# Patient Record
Sex: Male | Born: 1995 | Race: Black or African American | Hispanic: No | Marital: Single | Smoking: Never smoker
Health system: Southern US, Community
[De-identification: ages and names within clinical notes are randomized; demographics above are authoritative.]

---
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
## 2016-06-25 ENCOUNTER — Ambulatory Visit (INDEPENDENT_AMBULATORY_CARE_PROVIDER_SITE_OTHER): Payer: BLUE CROSS/BLUE SHIELD | Admitting: Family Medicine

## 2016-06-25 VITALS — BP 110/80 | HR 115 | Temp 103.8°F | Ht 70.5 in | Wt 231.8 lb

## 2016-06-25 DIAGNOSIS — J111 Influenza due to unidentified influenza virus with other respiratory manifestations: Secondary | ICD-10-CM | POA: Diagnosis not present

## 2016-06-25 MED ORDER — OSELTAMIVIR PHOSPHATE 75 MG PO CAPS: 75.0000 mg | ORAL_CAPSULE | Freq: Two times a day (BID) | ORAL | 0 refills | Status: DC

## 2016-06-25 MED ORDER — ACETAMINOPHEN 500 MG PO TABS
1000.0000 mg | ORAL_TABLET | Freq: Once | ORAL | Status: AC
  Administered 2016-06-25: 1000 mg via ORAL

## 2016-06-25 NOTE — Progress Notes (Signed)
Subjective:  By signing my name below, I, Raven Small, attest that this documentation has been prepared under the direction and in the presence of Meredith StaggersJeffrey Greene, MD.  Electronically Signed: Andrew Auaven Small, ED Scribe. 06/25/2016. 5:59 PM.   Patient ID: Mitchell Vaughn, male    DOB: [birth year], 21 y.o.   MRN: [id number]  HPI Chief Complaint  Patient presents with  . Cough    X 1 day headache, chills, bodyache, very weak, fever, sob    HPI Comments: Mitchell Vaughn is a 21 y.o. male who [facility] complaining of cough. Symptoms started with productive cough consisting of yellow/green sputum  2 days ago. Yesterday he developed fever, chills, body aches, muscle cramps and nausea. He also notes 1 episode of diarrhea yesterday. Pt has been able to drink fluids. He has been taking nyquil. He denies emesis and urinary symptoms. Pt did not get flu shot this year. He denies hx of asthma. He is not a smoker.   Patient Active Problem List   Diagnosis Date Noted  . Goiter 11/23/2012  . Overweight 11/23/2012  . Depression 11/23/2012  . Gynecomastia, male 11/10/2012   History reviewed. No pertinent past medical history. Past Surgical History:  Procedure Laterality Date  . TRACHEAL SURGERY Bilateral 1998   No Known Allergies Prior to Admission medications   Not on File   Social History   Social History  . Marital status: Single    Spouse name: N/A  . Number of children: N/A  . Years of education: N/A   Occupational History  . Not on file.   Social History Main Topics  . Smoking status: Never Smoker  . Smokeless tobacco: Never Used  . Alcohol use No  . Drug use: No  . Sexual activity: Yes    Birth control/ protection: Condom     Comment: ONe male partner - condoms every time   Other Topics Concern  . Not on file   Social History Narrative   Lives at home with mom and dad, attend [employer] will start 12th grade in the fall.   Review of  Systems  Constitutional: Positive for appetite change, chills, diaphoresis, fatigue and fever.  HENT: Negative for sore throat.   Respiratory: Positive for cough.   Gastrointestinal: Positive for diarrhea and nausea. Negative for vomiting.  Genitourinary: Negative for difficulty urinating.  Musculoskeletal: Positive for myalgias.       Objective:   Physical Exam  Constitutional: He is oriented to person, place, and time.  HENT:  Head: Normocephalic.  Right Ear: Hearing, tympanic membrane, external ear and ear canal normal.  Left Ear: Hearing, tympanic membrane, external ear and ear canal normal.  Nose: Nose normal.  Mouth/Throat: Posterior oropharyngeal erythema present.  Tonsil 2+  Neck: No thyromegaly present.  Cardiovascular: Regular rhythm, S1 normal and S2 normal.  Tachycardia present.   No murmur heard. Pulmonary/Chest: Effort normal and breath sounds normal. He has no wheezes. He has no rales.  Abdominal: Soft. Bowel sounds are normal. There is no tenderness. There is no CVA tenderness.  Lymphadenopathy:    He has cervical adenopathy ( anterior).  Neurological: He is alert and oriented to person, place, and time.  Skin: Skin is warm. He is diaphoretic.  Psychiatric: He has a normal mood and affect. His behavior is normal.   Vitals:   06/25/16 1732  BP: 110/80  Pulse: (!) 115  Temp: (!) 103.8 F (39.9 C)  TempSrc: Oral  SpO2: 96%  Weight: 231 lb 12.8 oz (105.1 kg)  Height: 5' 10.5" (1.791 m)   Assessment & Plan:   1. Influenza     Orders Placed This Encounter  Procedures  . Care order/instruction:    Work Notes printed, AVS printed and GO    Meds ordered this encounter  Medications  . oseltamivir (TAMIFLU) 75 MG capsule    Sig: Take 1 capsule (75 mg total) by mouth 2 (two) times daily.    Dispense:  10 capsule    Refill:  0  . acetaminophen (TYLENOL) tablet 1,000 mg    I personally performed the services described in this documentation, which was  scribed in my presence. The recorded information has been reviewed and considered, and addended by me as needed.   Norberto Sorenson, M.D.  Primary Care at [facility] [address] [place], [place] [address] [phone] phone [phone] fax  07/13/16 4:11 AM

## 2016-06-25 NOTE — Patient Instructions (Addendum)
IF you received an x-ray today, you will receive an invoice from [facility]. Please contact [facility] at [phone] with questions or concerns regarding your invoice.   IF you received labwork today, you will receive an invoice from [facility]. Please contact [facility] at [phone] with questions or concerns regarding your invoice.   Our billing staff will not be able to assist you with questions regarding bills from these companies.  You will be contacted with the lab results as soon as they are available. The fastest way to get your results is to activate your My Chart account. Instructions are located on the last page of this paperwork. If you have not heard from us regarding the results in 2 weeks, please contact this office.      Rehydration, Adult Rehydration is the replacement of body fluids and salts and minerals (electrolytes) that are lost during dehydration. Dehydration is when there is not enough fluid or water in the body. This happens when you lose more fluids than you take in. Common causes of dehydration include:  Vomiting.  Diarrhea.  Excessive sweating, such as from heat exposure or exercise.  Taking medicines that cause the body to lose excess fluid (diuretics).  Impaired kidney function.  Not drinking enough fluid.  Certain illnesses or infections.  Certain poorly controlled long-term (chronic) illnesses, such as diabetes, heart disease, and kidney disease. Symptoms of mild dehydration may include thirst, dry lips and mouth, dry skin, and dizziness. Symptoms of severe dehydration may include increased heart rate, confusion, fainting, and not urinating. You can rehydrate by drinking certain fluids or getting fluids through an IV tube, as told by your health care provider. What are the risks? Generally, rehydration is safe. However, one problem that can happen is taking in too much fluid (overhydration). This is rare. If overhydration  happens, it can cause an electrolyte imbalance, kidney failure, or a decrease in salt (sodium) levels in the body. How to rehydrate Follow instructions from your health care provider for rehydration. The kind of fluid you should drink and the amount you should drink depend on your condition.  If directed by your health care provider, drink an oral rehydration solution (ORS). This is a drink designed to treat dehydration that is found in pharmacies and retail stores.  Make an ORS by following instructions on the package.  Start by drinking small amounts, about  cup (120 mL) every 5-10 minutes.  Slowly increase how much you drink until you have taken the amount recommended by your health care provider.  Drink enough clear fluids to keep your urine clear or pale yellow. If you were instructed to drink an ORS, finish the ORS first, then start slowly drinking other clear fluids. Drink fluids such as:  Water. Do not drink only water. Doing that can lead to having too little sodium in your body (hyponatremia).  Ice chips.  Fruit juice that you have added water to (diluted juice).  Low-calorie sports drinks.  If you are severely dehydrated, your health care provider may recommend that you receive fluids through an IV tube in the hospital.  Do not take sodium tablets. Doing that can lead to the condition of having too much sodium in your body (hypernatremia). Eating while you rehydrate Follow instructions from your health care provider about what to eat while you rehydrate. Your health care provider may recommend that you slowly begin eating regular foods in small amounts.  Eat foods that contain a healthy balance of electrolytes,  such as bananas, oranges, potatoes, tomatoes, and spinach.  Avoid foods that are greasy or contain a lot of fat or sugar. In some cases, you may get nutrition through a feeding tube that is passed through your nose and into your stomach (nasogastric tube, or NG  tube). This may be done if you have uncontrolled vomiting or diarrhea. Beverages to avoid Certain beverages may make dehydration worse. While you rehydrate, avoid:  Alcohol.  Caffeine.  Drinks that contain a lot of sugar. These include:  High-calorie sports drinks.  Fruit juice that is not diluted.  Soda. Check nutrition labels to see how much sugar or caffeine a beverage contains. Signs of dehydration recovery You may be recovering from dehydration if:  You are urinating more often than before you started rehydrating.  Your urine is clear or pale yellow.  Your energy level improves.  You vomit less frequently.  You have diarrhea less frequently.  Your appetite improves or returns to normal.  You feel less dizzy or less light-headed.  Your skin tone and color start to look more normal. Contact a health care provider if:  You continue to have symptoms of mild dehydration, such as:  Thirst.  Dry lips.  Slightly dry mouth.  Dry, warm skin.  Dizziness.  You continue to vomit or have diarrhea. Get help right away if:  You have symptoms of dehydration that get worse.  You feel:  Confused.  Weak.  Like you are going to faint.  You have not urinated in 6-8 hours.  You have very dark urine.  You have trouble breathing.  Your heart rate while sitting still is over 100 beats a minute.  You cannot drink fluids without vomiting.  You have vomiting or diarrhea that:  Gets worse.  Does not go away.  You have a fever. This information is not intended to replace advice given to you by your health care provider. Make sure you discuss any questions you have with your health care provider. Document Released: 08/11/2011 Document Revised: 12/07/2015 Document Reviewed: 07/13/2015 Elsevier Interactive Patient Education  2017 Elsevier Inc.  Influenza, Adult Influenza, more commonly known as "the flu," is a viral infection that primarily affects the  respiratory tract. The respiratory tract includes organs that help you breathe, such as the lungs, nose, and throat. The flu causes many common cold symptoms, as well as a high fever and body aches. The flu spreads easily from person to person (is contagious). Getting a flu shot (influenza vaccination) every year is the best way to prevent influenza. What are the causes? Influenza is caused by a virus. You can catch the virus by:  Breathing in droplets from an infected person's cough or sneeze.  Touching something that was recently contaminated with the virus and then touching your mouth, nose, or eyes. What increases the risk? The following factors may make you more likely to get the flu:  Not cleaning your hands frequently with soap and water or alcohol-based hand sanitizer.  Having close contact with many people during cold and flu season.  Touching your mouth, eyes, or nose without washing or sanitizing your hands first.  Not drinking enough fluids or not eating a healthy diet.  Not getting enough sleep or exercise.  Being under a high amount of stress.  Not getting a yearly (annual) flu shot. You may be at a higher risk of complications from the flu, such as a severe lung infection (pneumonia), if you:  Are over the age of 22.  Are pregnant.  Have a weakened disease-fighting system (immune system). You may have a weakened immune system if you:  Have HIV or AIDS.  Are undergoing chemotherapy.  Aretaking medicines that reduce the activity of (suppress) the immune system.  Have a long-term (chronic) illness, such as heart disease, kidney disease, diabetes, or lung disease.  Have a liver disorder.  Are obese.  Have anemia. What are the signs or symptoms? Symptoms of this condition typically last 4-10 days and may include:  Fever.  Chills.  Headache, body aches, or muscle aches.  Sore throat.  Cough.  Runny or congested nose.  Chest discomfort and  cough.  Poor appetite.  Weakness or tiredness (fatigue).  Dizziness.  Nausea or vomiting. How is this diagnosed? This condition may be diagnosed based on your medical history and a physical exam. Your health care provider may do a nose or throat swab test to confirm the diagnosis. How is this treated? If influenza is detected early, you can be treated with antiviral medicine that can reduce the length of your illness and the severity of your symptoms. This medicine may be given by mouth (orally) or through an IV tube that is inserted in one of your veins. The goal of treatment is to relieve symptoms by taking care of yourself at home. This may include taking over-the-counter medicines, drinking plenty of fluids, and adding humidity to the air in your home. In some cases, influenza goes away on its own. Severe influenza or complications from influenza may be treated in a hospital. Follow these instructions at home:  Take over-the-counter and prescription medicines only as told by your health care provider.  Use a cool mist humidifier to add humidity to the air in your home. This can make breathing easier.  Rest as needed.  Drink enough fluid to keep your urine clear or pale yellow.  Cover your mouth and nose when you cough or sneeze.  Wash your hands with soap and water often, especially after you cough or sneeze. If soap and water are not available, use hand sanitizer.  Stay home from work or school as told by your health care provider. Unless you are visiting your health care provider, try to avoid leaving home until your fever has been gone for 24 hours without the use of medicine.  Keep all follow-up visits as told by your health care provider. This is important. How is this prevented?  Getting an annual flu shot is the best way to avoid getting the flu. You may get the flu shot in late summer, fall, or winter. Ask your health care provider when you should get your flu  shot.  Wash your hands often or use hand sanitizer often.  Avoid contact with people who are sick during cold and flu season.  Eat a healthy diet, drink plenty of fluids, get enough sleep, and exercise regularly. Contact a health care provider if:  You develop new symptoms.  You have:  Chest pain.  Diarrhea.  A fever.  Your cough gets worse.  You produce more mucus.  You feel nauseous or you vomit. Get help right away if:  You develop shortness of breath or difficulty breathing.  Your skin or nails turn a bluish color.  You have severe pain or stiffness in your neck.  You develop a sudden headache or sudden pain in your face or ear.  You cannot stop vomiting. This information is not intended to replace advice given to you by your health care  provider. Make sure you discuss any questions you have with your health care provider. Document Released: 05/16/2000 Document Revised: 10/25/2015 Document Reviewed: 03/13/2015 Elsevier Interactive Patient Education  2017 [employer].

## 2016-11-18 ENCOUNTER — Ambulatory Visit (INDEPENDENT_AMBULATORY_CARE_PROVIDER_SITE_OTHER): Payer: BLUE CROSS/BLUE SHIELD | Admitting: Physician Assistant

## 2016-11-18 ENCOUNTER — Encounter: Payer: Self-pay | Admitting: Physician Assistant

## 2016-11-18 VITALS — BP 123/74 | HR 76 | Temp 99.0°F | Resp 16 | Ht 69.5 in | Wt 207.6 lb

## 2016-11-18 DIAGNOSIS — S0181XA Laceration without foreign body of other part of head, initial encounter: Secondary | ICD-10-CM

## 2016-11-18 DIAGNOSIS — Z23 Encounter for immunization: Secondary | ICD-10-CM

## 2016-11-18 NOTE — Patient Instructions (Addendum)
Sutured Wound Care Sutures are stitches that can be used to close wounds. Taking care of your wound properly can help prevent pain and infection. It can also help your wound to heal more quickly. How is this treated? Wound Care  Keep the wound clean and dry.  If you were given a bandage (dressing), change it at least one time per day or as told by your doctor. You should also change it if it gets wet or dirty.  Keep the wound completely dry for the first 24 hours or as told by your doctor. After that time, you may shower or bathe. However, make sure that the wound is not soaked in water until the sutures have been removed.  Clean the wound one time each day or as told by your doctor. ? Wash the wound with soap and water. ? Rinse the wound with water to remove all soap. ? Pat the wound dry with a clean towel. Do not rub the wound.  After cleaning the wound, put a thin layer of antibiotic ointment on it as told your doctor. This ointment: ? Helps to prevent infection. ? Keeps the bandage from sticking to the wound.  Have the sutures removed as told by your doctor. General Instructions  Take or apply medicines only as told by your doctor.  To help prevent scarring, make sure to cover your wound with sunscreen whenever you are outside after the sutures are removed and the wound is healed. Make sure to wear a sunscreen of at least 30 SPF.  If you were prescribed an antibiotic medicine or ointment, finish all of it even if you start to feel better.  Do not scratch or pick at the wound.  Keep all follow-up visits as told by your doctor. This is important.  Check your wound every day for signs of infection. Watch for: ? Redness, swelling, or pain. ? Fluid, blood, or pus.  Raise (elevate) the injured area above the level of your heart while you are sitting or lying down, if possible.  Avoid stretching your wound.  Drink enough fluids to keep your pee (urine) clear or pale  yellow. Contact a doctor if:  You were given a tetanus shot and you have any of these where the needle went in: ? Swelling. ? Very bad pain. ? Redness. ? Bleeding.  You have a fever.  A wound that was closed breaks open.  You notice a bad smell coming from the wound.  You notice something coming out of the wound, such as wood or glass.  Medicine does not help your pain.  You have any of these at the site of the wound. ? More redness. ? More swelling. ? More pain.  You have any of these coming from the wound. ? Fluid. ? Blood. ? Pus.  You notice a change in the color of your skin near the wound.  You need to change the bandage often due to fluid, blood, or pus coming from the wound.  You have a new rash.  You have numbness around the wound. Get help right away if:  You have very bad swelling around the wound.  Your pain suddenly gets worse and is very bad.  You have painful lumps near the wound or on skin that is anywhere on your body.  You have a red streak going away from the wound.  The wound is on your hand or foot and you cannot move a finger or toe like normal.  The   wound is on your hand or foot and you notice that your fingers or toes look pale or bluish. This information is not intended to replace advice given to you by your health care provider. Make sure you discuss any questions you have with your health care provider. Document Released: 11/05/2007 Document Revised: 10/25/2015 Document Reviewed: 12/29/2012 Elsevier Interactive Patient Education  2017 Elsevier Inc.     IF you received an x-ray today, you will receive an invoice from [facility]. Please contact [facility] at [phone] with questions or concerns regarding your invoice.   IF you received labwork today, you will receive an invoice from [facility]. Please contact [facility] at [phone] with questions or concerns regarding your invoice.   Our billing staff will not  be able to assist you with questions regarding bills from these companies.  You will be contacted with the lab results as soon as they are available. The fastest way to get your results is to activate your My Chart account. Instructions are located on the last page of this paperwork. If you have not heard from us regarding the results in 2 weeks, please contact this office.      

## 2016-11-23 NOTE — Progress Notes (Signed)
[facility] [address], [place] [phone]  Date:  11/18/2016   Name:  Mitchell Vaughn   DOB:  1995/12/18   MRN:  [id number]  PCP:  Patient, No Pcp Per    History of Present Illness:  Mitchell Vaughn is a 21 y.o. male patient who presents to PCP with  Chief Complaint  Patient presents with  . Facial Injury    UNDER RIGHT EYE TODAY ABOUT 30 MINUTES AGO     Patient reports he was playing basketball, when he was elbowed in the face and had a gash.  No loc.  No pain with eye movement.    Patient Active Problem List   Diagnosis Date Noted  . Goiter 11/23/2012  . Overweight 11/23/2012  . Depression 11/23/2012  . Gynecomastia, male 11/10/2012    No past medical history on file.  Past Surgical History:  Procedure Laterality Date  . TRACHEAL SURGERY Bilateral 1998    Social History  Substance Use Topics  . Smoking status: Never Smoker  . Smokeless tobacco: Never Used  . Alcohol use No    Family History  Problem Relation Age of Onset  . Hypertension Maternal Grandmother   . Hypertension Maternal Grandfather   . Cancer Paternal Grandmother     Allergies  Allergen Reactions  . Penicillins Itching    Medication list has been reviewed and updated.  No current outpatient prescriptions on file prior to visit.   No current facility-administered medications on file prior to visit.     ROS ROS otherwise unremarkable unless listed above.  Physical Examination: BP 123/74 (BP Location: Right Arm, Patient Position: Sitting, Cuff Size: Large)   Pulse 76   Temp 99 F (37.2 C) (Oral)   Resp 16   Ht 5' 9.5" (1.765 m)   Wt 207 lb 9.6 oz (94.2 kg)   SpO2 99%   BMI 30.22 kg/m  Ideal Body Weight: Weight in (lb) to have BMI = 25: 171.4  Physical Exam  Constitutional: He is oriented to person, place, and time. He appears well-developed and well-nourished. No distress.  HENT:  Head: Normocephalic and atraumatic.  Eyes: Conjunctivae and  EOM are normal. Pupils are equal, round, and reactive to light.  Mild ecchymosis under the eyelid  1cm beneath the right eye.    Cardiovascular: Normal rate.   Pulmonary/Chest: Effort normal. No respiratory distress.  Neurological: He is alert and oriented to person, place, and time.  Skin: Skin is warm and dry. He is not diaphoretic.  Psychiatric: He has a normal mood and affect. His behavior is normal.   Procedure: verbal consent obtained.  1% lidocaine applied to the area of the laceration.  Cleansed with soap and water.  7-0 ethilon utilized to place 2 simple interrupted sutures at the laceration site.  Cleansed with normal saline.  Thin layer of mupirocin placed over the area.     Assessment and Plan: Mitchell Vaughn is a 21 y.o. male who is here today for cc of laceration . Tetanus given rtc in 5 days for suture removal.  Facial laceration, initial encounter - Plan: Tdap vaccine greater than or equal to 7yo IM  Trena PlattStephanie Yeila Morro, PA-C [facility] 6/24/20182:28 AM

## 2016-11-24 ENCOUNTER — Encounter: Payer: Self-pay | Admitting: Emergency Medicine

## 2016-11-24 ENCOUNTER — Ambulatory Visit (INDEPENDENT_AMBULATORY_CARE_PROVIDER_SITE_OTHER): Payer: BLUE CROSS/BLUE SHIELD | Admitting: Emergency Medicine

## 2016-11-24 VITALS — BP 100/60 | HR 78 | Temp 98.3°F | Resp 16 | Ht 69.75 in | Wt 211.4 lb

## 2016-11-24 DIAGNOSIS — S0181XA Laceration without foreign body of other part of head, initial encounter: Secondary | ICD-10-CM | POA: Insufficient documentation

## 2016-11-24 DIAGNOSIS — S0181XD Laceration without foreign body of other part of head, subsequent encounter: Secondary | ICD-10-CM

## 2016-11-24 DIAGNOSIS — Z4802 Encounter for removal of sutures: Secondary | ICD-10-CM | POA: Diagnosis not present

## 2016-11-24 NOTE — Progress Notes (Signed)
Mitchell Vaughn 20 y.o.   Chief Complaint  Patient presents with  . Suture / Staple Removal    FACE    HISTORY OF PRESENT ILLNESS: This is a 21 y.o. male here for suture removal; sutures placed 6 days ago. HPI   Prior to Admission medications   Not on File    Allergies  Allergen Reactions  . Penicillins Itching    Patient Active Problem List   Diagnosis Date Noted  . Goiter 11/23/2012  . Overweight 11/23/2012  . Depression 11/23/2012  . Gynecomastia, male 11/10/2012    No past medical history on file.  Past Surgical History:  Procedure Laterality Date  . TRACHEAL SURGERY Bilateral 1998    Social History   Social History  . Marital status: Single    Spouse name: N/A  . Number of children: N/A  . Years of education: N/A   Occupational History  . Not on file.   Social History Main Topics  . Smoking status: Never Smoker  . Smokeless tobacco: Never Used  . Alcohol use No  . Drug use: No  . Sexual activity: Yes    Birth control/ protection: Condom     Comment: ONe male partner - condoms every time   Other Topics Concern  . Not on file   Social History Narrative   Lives at home with mom and dad, attend [employer] will start 12th grade in the fall.    Family History  Problem Relation Age of Onset  . Hypertension Maternal Grandmother   . Hypertension Maternal Grandfather   . Cancer Paternal Grandmother      Review of Systems  Constitutional: Negative for chills and fever.  Skin: Negative for rash.   Vitals:   11/24/16 1406  BP: 100/60  Pulse: 78  Resp: 16  Temp: 98.3 F (36.8 C)    Physical Exam  Constitutional: He is oriented to person, place, and time. He appears well-developed and well-nourished.  HENT:  Head: Normocephalic and atraumatic.  Eyes: EOM are normal. Pupils are equal, round, and reactive to light.  Neck: Normal range of motion.  Cardiovascular: Normal rate.   Pulmonary/Chest: Effort normal.  Musculoskeletal:  Normal range of motion.  Neurological: He is alert and oriented to person, place, and time.  Skin: Skin is warm and dry. Capillary refill takes less than 2 seconds.  Well-healed laceration to right infraorbital area; 2 sutures in place.  Psychiatric: He has a normal mood and affect. His behavior is normal.  Vitals reviewed.  Patient presents for suture removal. The wound is well healed without signs of infection.  The sutures are removed. Wound care and activity instructions given. Return prn.   ASSESSMENT & PLAN: Mitchell Vaughn was seen today for suture / staple removal.  Diagnoses and all orders for this visit:  Facial laceration, subsequent encounter  Encounter for removal of sutures   Patient Instructions       IF you received an x-ray today, you will receive an invoice from [facility]. Please contact [facility] at [phone] with questions or concerns regarding your invoice.   IF you received labwork today, you will receive an invoice from [facility]. Please contact [facility] at [phone] with questions or concerns regarding your invoice.   Our billing staff will not be able to assist you with questions regarding bills from these companies.  You will be contacted with the lab results as soon as they are available. The fastest way to get your results is to activate  your My Chart account. Instructions are located on the last page of this paperwork. If you have not heard from us regarding the results in 2 weeks, please contact this office.    Suture Removal, Care After Refer to this sheet in the next few weeks. These instructions provide you with information on caring for yourself after your procedure. Your health care provider may also give you more specific instructions. Your treatment has been planned according to current medical practices, but problems sometimes occur. Call your health care provider if you have any problems or questions after your  procedure. What can I expect after the procedure? After your stitches (sutures) are removed, it is typical to have the following:  Some discomfort and swelling in the wound area.  Slight redness in the area.  Follow these instructions at home:  If you have skin adhesive strips over the wound area, do not take the strips off. They will fall off on their own in a few days. If the strips remain in place after 14 days, you may remove them.  Change any bandages (dressings) at least once a day or as directed by your health care provider. If the bandage sticks, soak it off with warm, soapy water.  Apply cream or ointment only as directed by your health care provider. If using cream or ointment, wash the area with soap and water 2 times a day to remove all the cream or ointment. Rinse off the soap and pat the area dry with a clean towel.  Keep the wound area dry and clean. If the bandage becomes wet or dirty, or if it develops a bad smell, change it as soon as possible.  Continue to protect the wound from injury.  Use sunscreen when out in the sun. New scars become sunburned easily. Contact a health care provider if:  You have increasing redness, swelling, or pain in the wound.  You see pus coming from the wound.  You have a fever.  You notice a bad smell coming from the wound or dressing.  Your wound breaks open (edges not staying together). This information is not intended to replace advice given to you by your health care provider. Make sure you discuss any questions you have with your health care provider. Document Released: 02/11/2001 Document Revised: 10/25/2015 Document Reviewed: 12/29/2012 Elsevier Interactive Patient Education  2017 [employer] Inc.      Edwina BarthMiguel Sherrell Farish, MD [facility]

## 2016-11-24 NOTE — Patient Instructions (Addendum)
     IF you received an x-ray today, you will receive an invoice from [facility]. Please contact [facility] at [phone] with questions or concerns regarding your invoice.   IF you received labwork today, you will receive an invoice from [facility]. Please contact [facility] at [phone] with questions or concerns regarding your invoice.   Our billing staff will not be able to assist you with questions regarding bills from these companies.  You will be contacted with the lab results as soon as they are available. The fastest way to get your results is to activate your My Chart account. Instructions are located on the last page of this paperwork. If you have not heard from us regarding the results in 2 weeks, please contact this office.    Suture Removal, Care After Refer to this sheet in the next few weeks. These instructions provide you with information on caring for yourself after your procedure. Your health care provider may also give you more specific instructions. Your treatment has been planned according to current medical practices, but problems sometimes occur. Call your health care provider if you have any problems or questions after your procedure. What can I expect after the procedure? After your stitches (sutures) are removed, it is typical to have the following:  Some discomfort and swelling in the wound area.  Slight redness in the area.  Follow these instructions at home:  If you have skin adhesive strips over the wound area, do not take the strips off. They will fall off on their own in a few days. If the strips remain in place after 14 days, you may remove them.  Change any bandages (dressings) at least once a day or as directed by your health care provider. If the bandage sticks, soak it off with warm, soapy water.  Apply cream or ointment only as directed by your health care provider. If using cream or ointment, wash the area with soap and water 2  times a day to remove all the cream or ointment. Rinse off the soap and pat the area dry with a clean towel.  Keep the wound area dry and clean. If the bandage becomes wet or dirty, or if it develops a bad smell, change it as soon as possible.  Continue to protect the wound from injury.  Use sunscreen when out in the sun. New scars become sunburned easily. Contact a health care provider if:  You have increasing redness, swelling, or pain in the wound.  You see pus coming from the wound.  You have a fever.  You notice a bad smell coming from the wound or dressing.  Your wound breaks open (edges not staying together). This information is not intended to replace advice given to you by your health care provider. Make sure you discuss any questions you have with your health care provider. Document Released: 02/11/2001 Document Revised: 10/25/2015 Document Reviewed: 12/29/2012 Elsevier Interactive Patient Education  2017 [employer]  

## 2017-08-31 ENCOUNTER — Encounter: Payer: Self-pay | Admitting: Physician Assistant

## 2021-08-23 ENCOUNTER — Other Ambulatory Visit: Payer: Self-pay

## 2021-08-23 ENCOUNTER — Emergency Department (HOSPITAL_COMMUNITY): Payer: No Typology Code available for payment source

## 2021-08-23 ENCOUNTER — Emergency Department (HOSPITAL_COMMUNITY)
Admission: EM | Admit: 2021-08-23 | Discharge: 2021-08-23 | Disposition: A | Discharge status: Home / Self Care | Payer: No Typology Code available for payment source | Attending: Emergency Medicine | Admitting: Emergency Medicine

## 2021-08-23 DIAGNOSIS — S29011A Strain of muscle and tendon of front wall of thorax, initial encounter: Secondary | ICD-10-CM | POA: Diagnosis not present

## 2021-08-23 DIAGNOSIS — S299XXA Unspecified injury of thorax, initial encounter: Secondary | ICD-10-CM | POA: Diagnosis present

## 2021-08-23 DIAGNOSIS — X509XXA Other and unspecified overexertion or strenuous movements or postures, initial encounter: Secondary | ICD-10-CM | POA: Diagnosis not present

## 2021-08-23 DIAGNOSIS — D72829 Elevated white blood cell count, unspecified: Secondary | ICD-10-CM | POA: Diagnosis not present

## 2021-08-23 DIAGNOSIS — R1031 Right lower quadrant pain: Secondary | ICD-10-CM | POA: Insufficient documentation

## 2021-08-23 LAB — URINALYSIS, ROUTINE W REFLEX MICROSCOPIC
Bilirubin Urine: NEGATIVE
Glucose, UA: NEGATIVE mg/dL
Hgb urine dipstick: NEGATIVE
Ketones, ur: 5 mg/dL — AB
Leukocytes,Ua: NEGATIVE
Nitrite: NEGATIVE
Protein, ur: NEGATIVE mg/dL
Specific Gravity, Urine: 1.035 — ABNORMAL HIGH (ref 1.005–1.030)
pH: 5 (ref 5.0–8.0)

## 2021-08-23 LAB — CBC WITH DIFFERENTIAL/PLATELET
Abs Immature Granulocytes: 0.03 10*3/uL (ref 0.00–0.07)
Basophils Absolute: 0 10*3/uL (ref 0.0–0.1)
Basophils Relative: 0 %
Eosinophils Absolute: 0.2 10*3/uL (ref 0.0–0.5)
Eosinophils Relative: 2 %
HCT: 43.3 % (ref 39.0–52.0)
Hemoglobin: 15.4 g/dL (ref 13.0–17.0)
Immature Granulocytes: 0 %
Lymphocytes Relative: 24 %
Lymphs Abs: 2.8 10*3/uL (ref 0.7–4.0)
MCH: 30.4 pg (ref 26.0–34.0)
MCHC: 35.6 g/dL (ref 30.0–36.0)
MCV: 85.4 fL (ref 80.0–100.0)
Monocytes Absolute: 0.9 10*3/uL (ref 0.1–1.0)
Monocytes Relative: 7 %
Neutro Abs: 7.9 10*3/uL — ABNORMAL HIGH (ref 1.7–7.7)
Neutrophils Relative %: 67 %
Platelets: 287 10*3/uL (ref 150–400)
RBC: 5.07 MIL/uL (ref 4.22–5.81)
RDW: 12.2 % (ref 11.5–15.5)
WBC: 11.9 10*3/uL — ABNORMAL HIGH (ref 4.0–10.5)
nRBC: 0 % (ref 0.0–0.2)

## 2021-08-23 LAB — BASIC METABOLIC PANEL
Anion gap: 7 (ref 5–15)
BUN: 19 mg/dL (ref 6–20)
CO2: 26 mmol/L (ref 22–32)
Calcium: 9.4 mg/dL (ref 8.9–10.3)
Chloride: 103 mmol/L (ref 98–111)
Creatinine, Ser: 1.07 mg/dL (ref 0.61–1.24)
GFR, Estimated: >60 mL/min (ref >60)
Glucose, Bld: 89 mg/dL (ref 70–99)
Potassium: 3.9 mmol/L (ref 3.5–5.1)
Sodium: 136 mmol/L (ref 135–145)

## 2021-08-23 MED ORDER — LIDOCAINE 5 % EX PTCH
1.0000 | MEDICATED_PATCH | CUTANEOUS | Status: DC
  Administered 2021-08-23: 1 via TRANSDERMAL
  Filled 2021-08-23: qty 1

## 2021-08-23 MED ORDER — OXYCODONE-ACETAMINOPHEN 5-325 MG PO TABS
1.0000 | ORAL_TABLET | Freq: Once | ORAL | Status: AC
  Administered 2021-08-23: 1 via ORAL
  Filled 2021-08-23: qty 1

## 2021-08-23 NOTE — ED Provider Notes (Signed)
?[facility]-EMERGENCY DEPT ?Provider Note ? ? ?CSN: [id number] ?Arrival date & time: 08/23/21  2017 ? ?  ? ?History ? ?Chief Complaint  ?Patient presents with  ? Flank Pain  ? ? ?Mitchell Vaughn is a 26 y.o. male who presents to the ED for evaluation of right-sided flank pain that started after coughing 2 days ago.  Patient states he heard a pop at the time and since then he has had a dull aching pain worse with laughing or coughing in the right lateral flank.  Patient denies urinary symptoms including dysuria, hematuria, increased frequency.  He denies illness including congestion, cough, diarrhea abdominal pain or vomiting.  No treatment prior to arrival.  He denies chest pain, numbness and tingling.  He has no other complaints. ? ? ?Flank Pain ? ? ?  ? ?Home Medications ?Prior to Admission medications   ?Not on File  ?   ? ?Allergies    ?Penicillins   ? ?Review of Systems   ?Review of Systems  ?Genitourinary:  Positive for flank pain.  ? ?Physical Exam ?Updated Vital Signs ?BP (!) 154/108 (BP Location: Left Arm)   Pulse 96   Temp 98.4 ?F (36.9 ?C) (Oral)   Resp 17   Ht 5\' 10"  (1.778 m)   Wt 117.9 kg   SpO2 93%   BMI 37.31 kg/m?  ?Physical Exam ?Vitals and nursing note reviewed.  ?Constitutional:   ?   General: He is not in acute distress. ?   Appearance: He is not ill-appearing.  ?HENT:  ?   Head: Atraumatic.  ?Eyes:  ?   Conjunctiva/sclera: Conjunctivae normal.  ?Cardiovascular:  ?   Rate and Rhythm: Normal rate and regular rhythm.  ?   Pulses: Normal pulses.  ?   Heart sounds: No murmur heard. ?Pulmonary:  ?   Effort: Pulmonary effort is normal. No respiratory distress.  ?   Breath sounds: Normal breath sounds.  ?Abdominal:  ?   General: Abdomen is flat. There is no distension.  ?   Palpations: Abdomen is soft.  ?   Tenderness: There is no abdominal tenderness.  ?Musculoskeletal:     ?   General: Normal range of motion.  ?   Cervical back: Normal range of motion.  ?   Comments:  Focally tender at the bottom right rib cage without bruising, swelling or palpable deformities  ?Skin: ?   General: Skin is warm and dry.  ?   Capillary Refill: Capillary refill takes less than 2 seconds.  ?Neurological:  ?   General: No focal deficit present.  ?   Mental Status: He is alert.  ?Psychiatric:     ?   Mood and Affect: Mood normal.  ? ? ?ED Results / Procedures / Treatments   ?Labs ?(all labs ordered are listed, but only abnormal results are displayed) ?Labs Reviewed  ?CBC WITH DIFFERENTIAL/PLATELET - Abnormal; Notable for the following components:  ?    Result Value  ? WBC 11.9 (*)   ? Neutro Abs 7.9 (*)   ? All other components within normal limits  ?URINALYSIS, ROUTINE W REFLEX MICROSCOPIC - Abnormal; Notable for the following components:  ? Specific Gravity, Urine 1.035 (*)   ? Ketones, ur 5 (*)   ? All other components within normal limits  ?BASIC METABOLIC PANEL  ? ? ?EKG ?None ? ?Radiology ?DG Ribs Unilateral W/Chest Right ? ?Result Date: 08/23/2021 ?CLINICAL DATA:  Right-sided rib pain following coughing episode, initial encounter EXAM: RIGHT  RIBS AND CHEST - 3+ VIEW COMPARISON:  None. FINDINGS: Cardiac shadow is within normal limits. The lungs are well aerated without focal infiltrate, effusion or pneumothorax. No acute rib fracture is noted. IMPRESSION: No acute rib fracture seen. Electronically Signed   By: Alcide Clever M.D.   On: 08/23/2021 21:17  ? ?CT Renal Stone Study ? ?Result Date: 08/23/2021 ?CLINICAL DATA:  Right flank pain. EXAM: CT ABDOMEN AND PELVIS WITHOUT CONTRAST TECHNIQUE: Multidetector CT imaging of the abdomen and pelvis was performed following the standard protocol without IV contrast. RADIATION DOSE REDUCTION: This exam was performed according to the departmental dose-optimization program which includes automated exposure control, adjustment of the mA and/or kV according to patient size and/or use of iterative reconstruction technique. COMPARISON:  None. FINDINGS: Lower  chest: No acute abnormality. Hepatobiliary: 19 cm length mildly steatotic liver, otherwise unremarkable without contrast. Gallbladder and bile ducts are unremarkable. Pancreas: Unremarkable without contrast. Spleen: Unremarkable without contrast and normal in size. Adrenals/Urinary Tract: There is no adrenal mass. The unenhanced renal cortex is unremarkable. There are no intrarenal stones. There is no hydronephrosis or ureteral stone either side. The bladder is contracted but grossly unremarkable. Stomach/Bowel: The stomach distended with food substrate and fluid but has a normal wall thickness. There is no small bowel dilatation or inflammation. The appendix is normal. There are scattered colonic diverticula without evidence of colitis or diverticulitis. No significant or large stool burden. Vascular/Lymphatic: No significant vascular findings are present. No enlarged abdominal or pelvic lymph nodes. Reproductive: Normal prostate size and shape. Other: There is no free air, hemorrhage or fluid common no incarcerated hernia. The ventral wall is intact. Musculoskeletal: Slight lumbar dextrorotary scoliosis. At L4-5, broad-based posterior disc osteophyte complex lateralizing to the left causes moderate spinal canal stenosis and appears to compress the descending left L5 nerve root. 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 No acute or further significant osseous findings. IMPRESSION: 1. No acute noncontrast CT abnormality. 2. No radiopaque urinary stone or obstructive uropathy findings. 3. Mildly prominent liver with mild steatosis. 4. Stomach distended with fluid and food products, which could be due to a recent meal or impaired gastric emptying. There is no duodenal dilatation or gastroduodenal wall thickening. 5. Uncomplicated colonic diverticula. 6. 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  Somewhat short pedicles noted in the lumbar region also. Electronically Signed   By: Almira Bar M.D.   On: 08/23/2021 22:14   ? ?Procedures ?Procedures  ? ? ?Medications Ordered in ED ?Medications  ?lidocaine (LIDODERM) 5 % 1 patch (has no administration in time range)  ?oxyCODONE-acetaminophen (PERCOCET/ROXICET) 5-325 MG per tablet 1 tablet (has no administration in time range)  ?oxyCODONE-acetaminophen (PERCOCET/ROXICET) 5-325 MG per tablet 1 tablet (1 tablet Oral Given 08/23/21 2056)  ? ? ?ED Course/ Medical Decision Making/ A&P ?  ?                        ?Medical Decision Making ?Amount and/or Complexity of Data Reviewed ?Labs: ordered. ?Radiology: ordered. ? ?Risk ?Prescription drug management. ? ? ?History:  ?Per HPI ?Social determinants of health: none ? ?Initial impression: ? ?This patient presents to the ED for concern of flank pain, this involves an extensive number of treatment options, and is a complaint that carries with it a high risk of complications and morbidity.   Differential diagnosis of her lower abdominal considerations include  pelvic inflammatory disease, ectopic pregnancy, appendicitis, urinary calculi, primary dysmenorrhea, septic abortion, ruptured ovarian cyst or tumor, ovarian torsion, tubo-ovarian abscess, degeneration of fibroid, endometriosis, diverticulitis, cystitis. ?Patient is 26 years old, otherwise healthy, in no acute distress.  He is nontoxic-appearing with normal vitals on exam.  He does have some focal tenderness of the bottom lateral right rib cage, possibly more abdominal.  I did consider obtaining CT abdomen, however given his age, lack of other symptoms and the nature of his pain, we will begin with chest and rib x-ray to rule out acute fracture.  Patient requests pain management. ? ? ?Lab Tests and EKG: ? ?I Ordered, reviewed, and interpreted labs and EKG.  The pertinent results include:  ?CBC with mild leukocytosis ?BMP and UA normal ? ? ?Imaging Studies  ordered: ? ?I ordered imaging studies including  ?Chest x-ray without acute findings ?CT renal stone.  Without evidence of renal stone ?I independently visualized and interpreted imaging and I agree with the radiologist interpre

## 2021-08-23 NOTE — Discharge Instructions (Addendum)
Your work-up today was very reassuring.  Your x-ray and your CT showed that you did not fracture your rib, furthermore your labs and CT show that you do not have a kidney stone.  What is most likely is that when he coughed, you pulled a muscle within your rib cage, which is that popping sound that you heard.  I have given you a lidocaine patch here in the emergency department, if you find that this helps you can get this over-the-counter at the drugstore.  It is called Salonpas.  Continue alternating Tylenol and Motrin for your discomfort.  If you have time, ice the area for 15 to 20 minutes at a time when you are at rest and avoid heavy lifting for the next week or 2. ? ?2-3 times a day, I also recommend gentle stretching the area to help relax the muscle.  If your symptoms do not improve in the next week or 2, please follow-up with your primary care physician. ?

## 2021-08-23 NOTE — ED Triage Notes (Signed)
Patient having right sided flank pain. No painful urination, no nausea or vomiting. No problem with bowel movements.  ?

## 2023-02-23 IMAGING — CT CT RENAL STONE PROTOCOL
2 of 4 series · 15 of 46 positions shown, 17 images · non-contrast
Comparison: None.

CLINICAL DATA: Right flank pain.



[Series 2: axial st · axial · 0.92mm/px · z∈[-355,+75]mm · 12 of 98 slices shown, 14 images]
[im 6/98  soft-tissue]
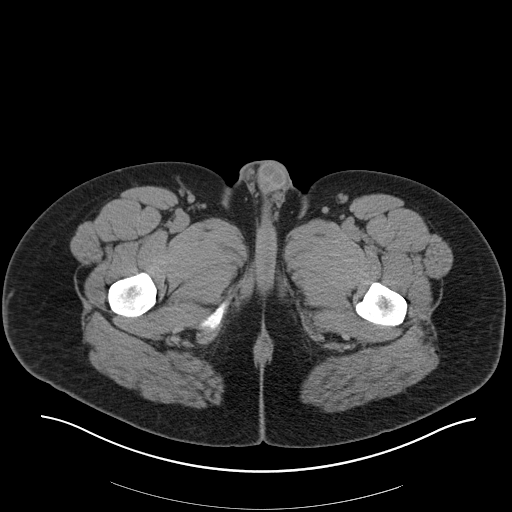
[im 6/98  bone]
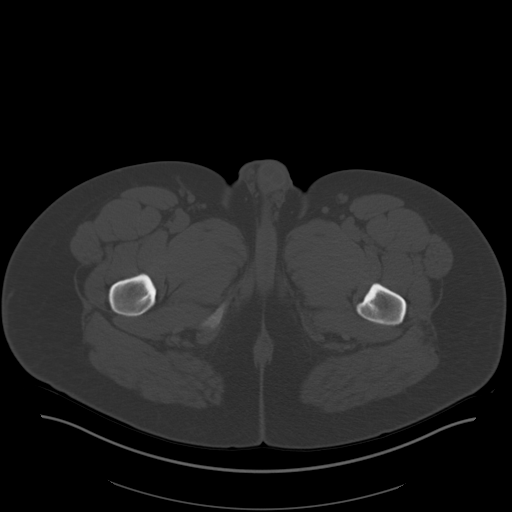
[im 16/98  soft-tissue]
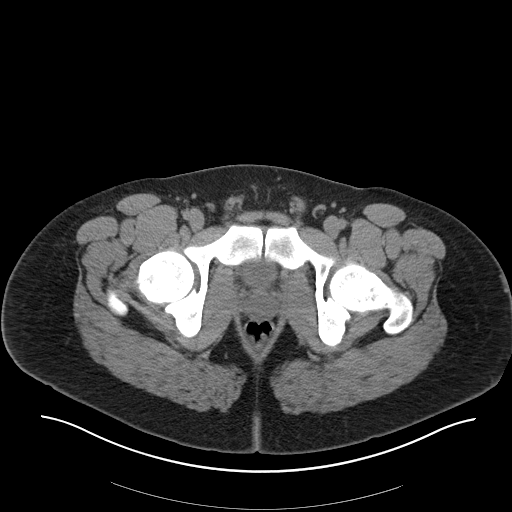
[im 21/98  soft-tissue]
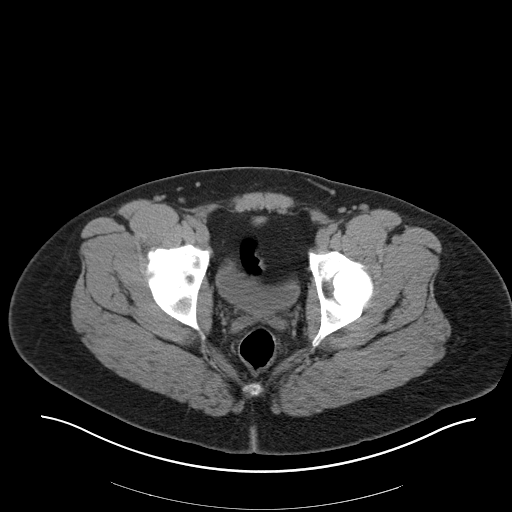
[im 31/98  soft-tissue]
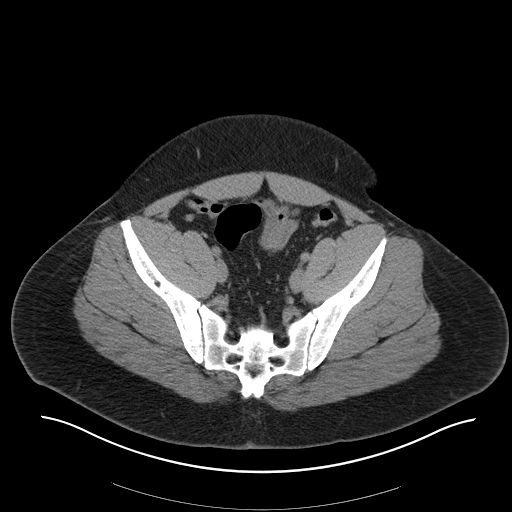
[im 36/98  soft-tissue]
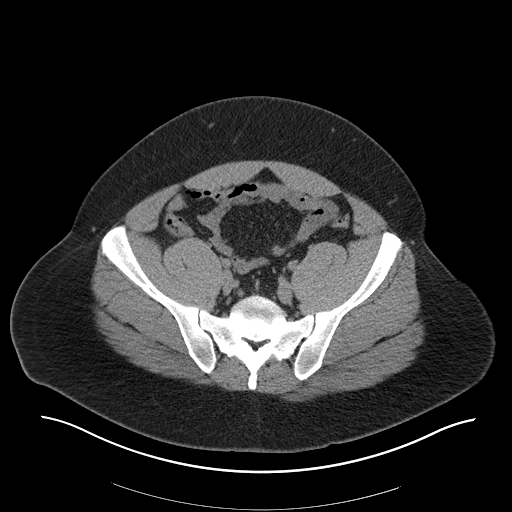
[im 46/98  soft-tissue]
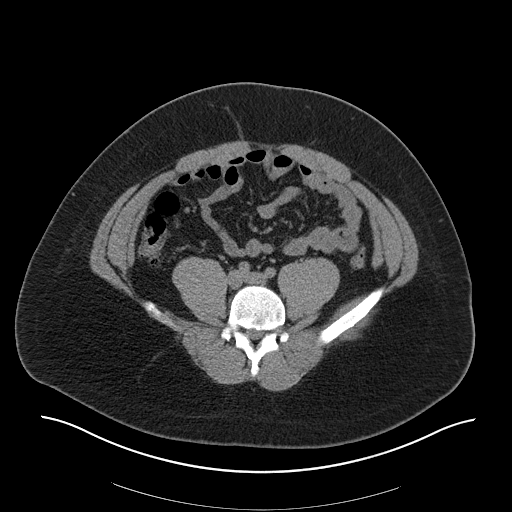
[im 52/98  soft-tissue]
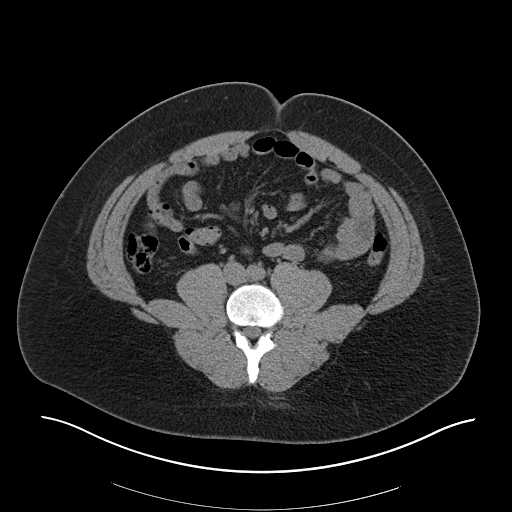
[im 62/98  soft-tissue]
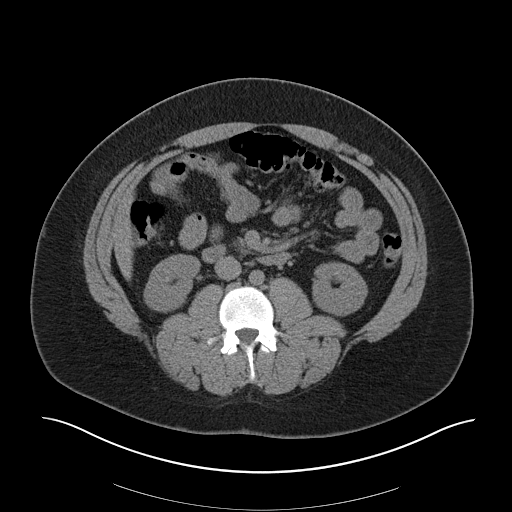
[im 67/98  soft-tissue]
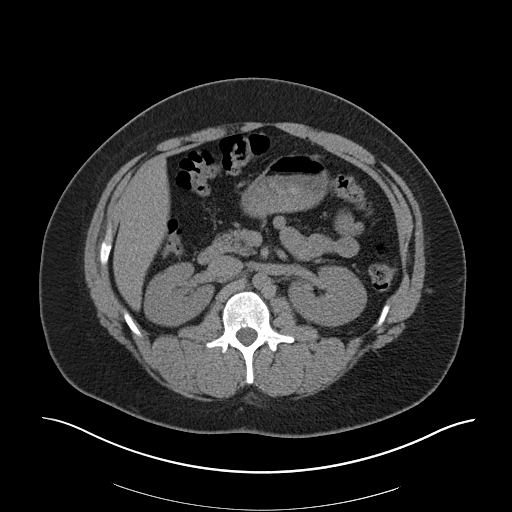
[im 67/98  bone]
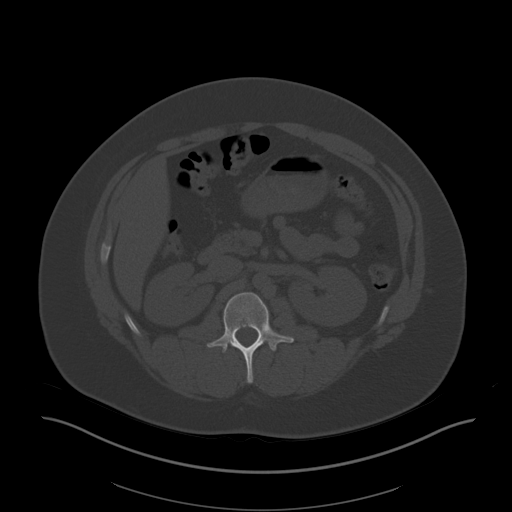
[im 77/98  soft-tissue]
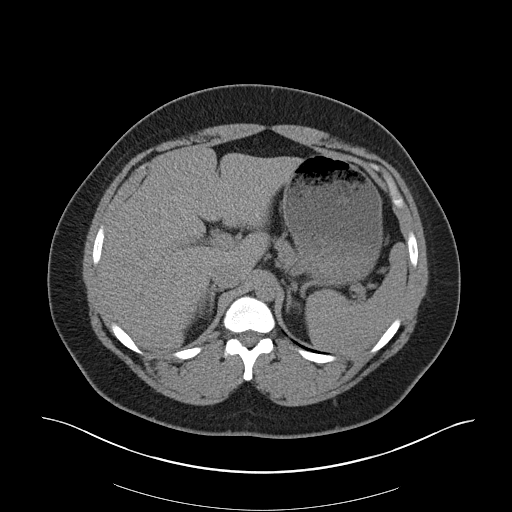
[im 82/98  soft-tissue]
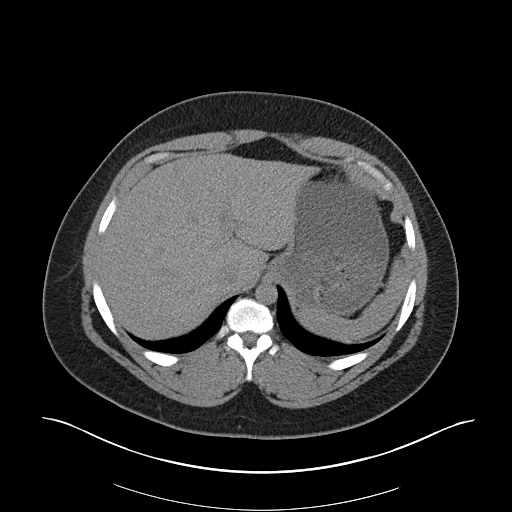
[im 92/98  soft-tissue]
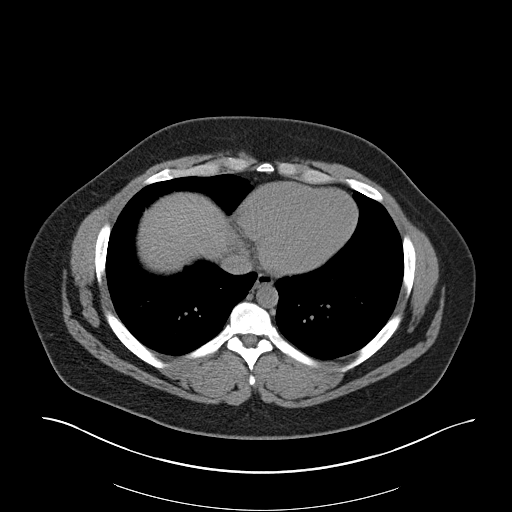

[Series 5: coronal · coronal · 0.82mm/px · 3 of 187 slices shown]
[im 63/187  soft-tissue]
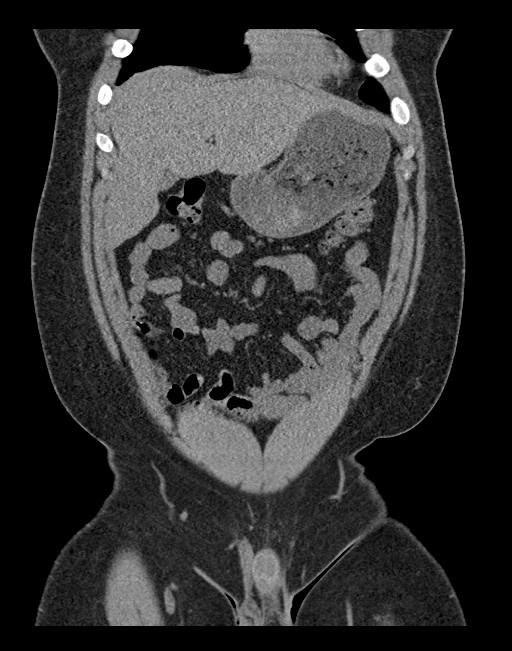
[im 83/187  soft-tissue]
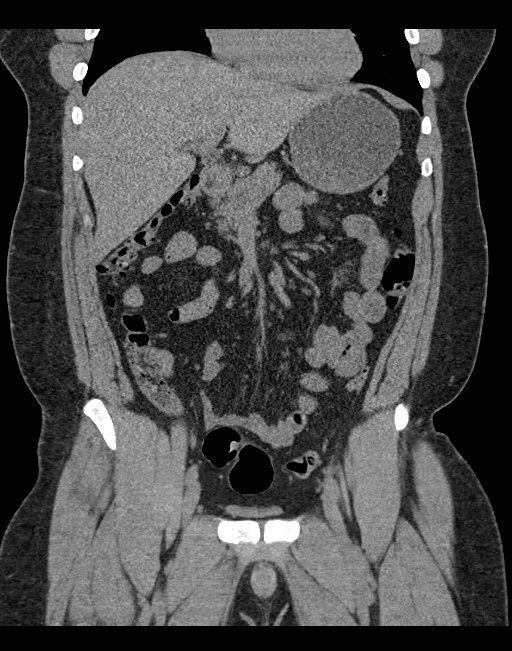
[im 104/187  soft-tissue]
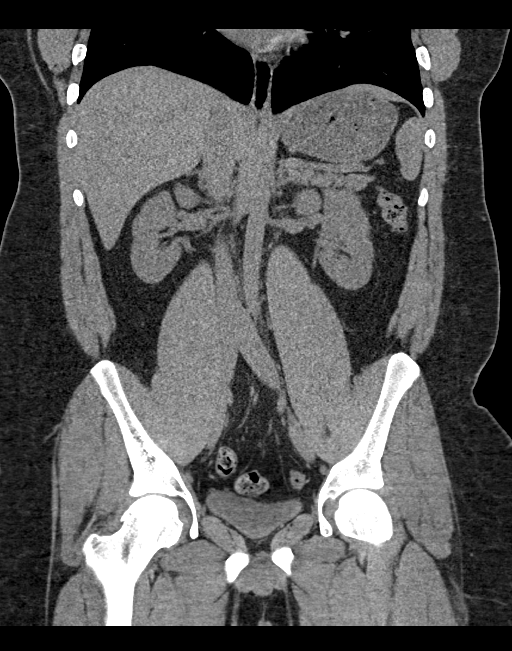

[15 of 46 positions shown; findings below may reference images not displayed]

FINDINGS: Lower chest: No acute abnormality.

Hepatobiliary: 19 cm length mildly steatotic liver, otherwise
unremarkable without contrast. Gallbladder and bile ducts are
unremarkable.

Pancreas: Unremarkable without contrast.

Spleen: Unremarkable without contrast and normal in size.

Adrenals/Urinary Tract: There is no adrenal mass. The unenhanced
renal cortex is unremarkable. There are no intrarenal stones. There
is no hydronephrosis or ureteral stone either side. The bladder is
contracted but grossly unremarkable.

Stomach/Bowel: The stomach distended with food substrate and fluid
but has a normal wall thickness. There is no small bowel dilatation
or inflammation.

The appendix is normal. There are scattered colonic diverticula
without evidence of colitis or diverticulitis. No significant or
large stool burden.

Vascular/Lymphatic: No significant vascular findings are present. No
enlarged abdominal or pelvic lymph nodes.

Reproductive: Normal prostate size and shape.

Other: There is no free air, hemorrhage or fluid common no
incarcerated hernia. The ventral wall is intact.

Musculoskeletal: Slight lumbar dextrorotary scoliosis. At L4-5,
broad-based posterior disc osteophyte complex lateralizing to the
left causes moderate spinal canal stenosis and appears to compress
the descending left L5 nerve root.

Lumbar pedicles are congenitally short. Mild symmetric hip DJD. No
acute or further significant osseous findings.
IMPRESSION: 1. No acute noncontrast CT abnormality.
2. No radiopaque urinary stone or obstructive uropathy findings.
3. Mildly prominent liver with mild steatosis.
4. Stomach distended with fluid and food products, which could be
due to a recent meal or impaired gastric emptying. There is no
duodenal dilatation or gastroduodenal wall thickening.
5. Uncomplicated colonic diverticula.
6. L4-5 disc osteophyte complex with left lateralization and
moderate spinal canal stenosis, with compression of the left
descending L5 nerve root. Somewhat short pedicles noted in the
lumbar region also.

## 2023-02-23 IMAGING — CR DG RIBS W/ CHEST 3+V*R*
5 series · 5 of 5 positions shown · non-contrast
Comparison: None.

CLINICAL DATA: Right-sided rib pain following coughing episode,
initial encounter

EXAM:
RIGHT RIBS AND CHEST - 3+ VIEW

[w chest pa]
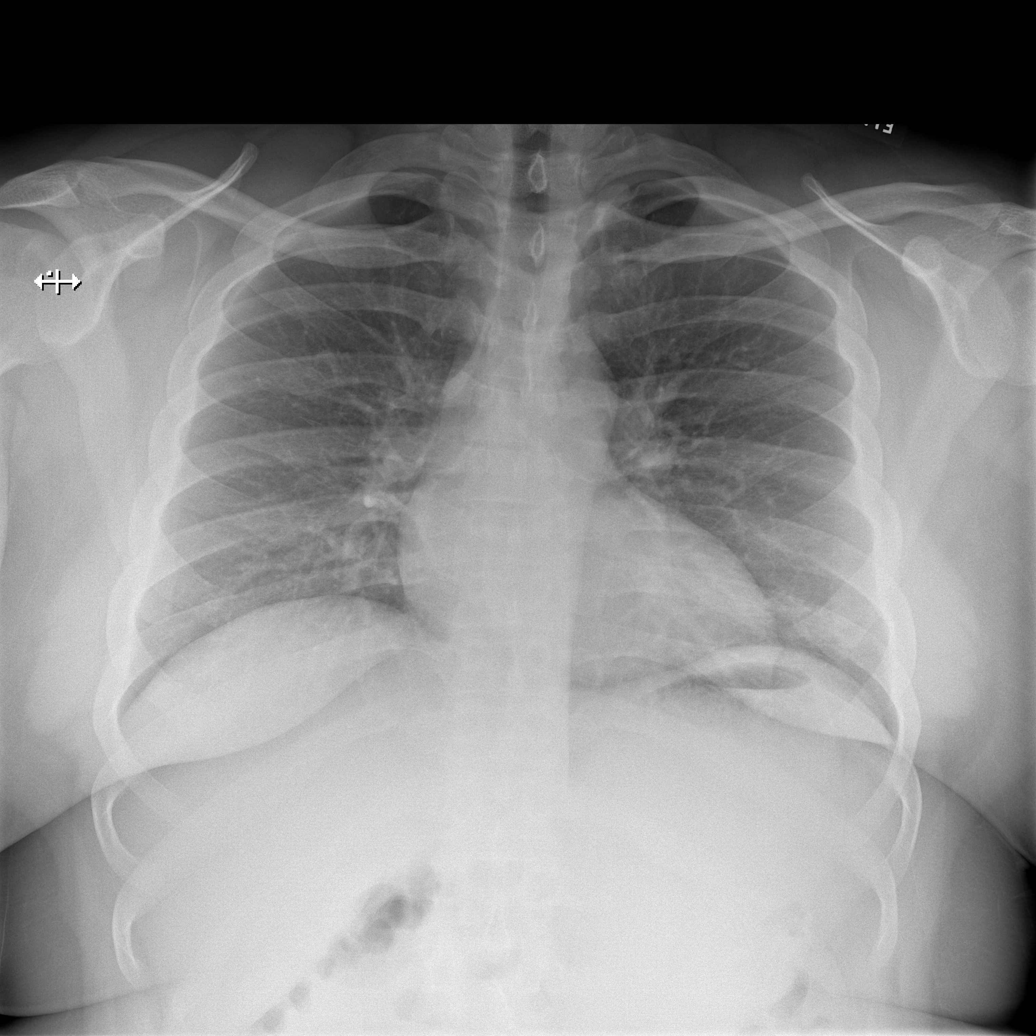

[w ribs ap upper right]
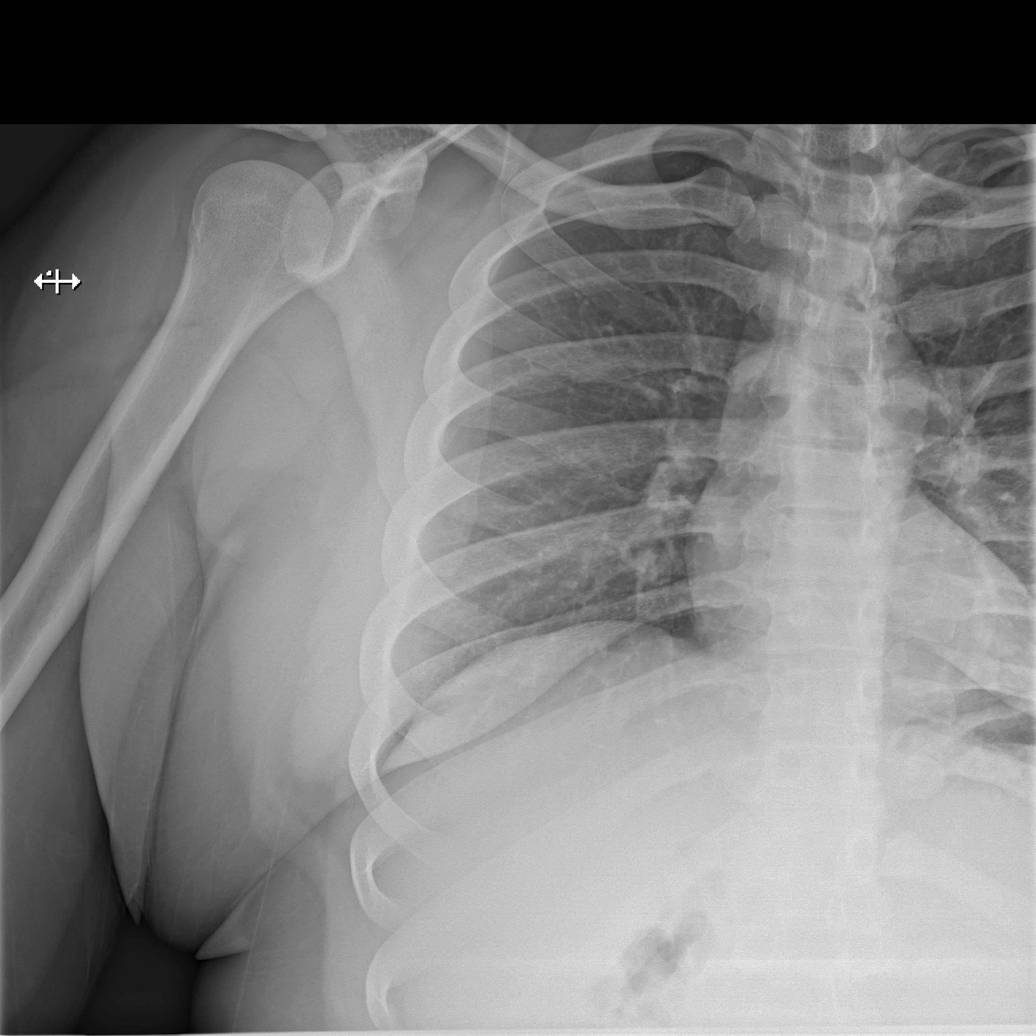

[w ribs ap lower right]
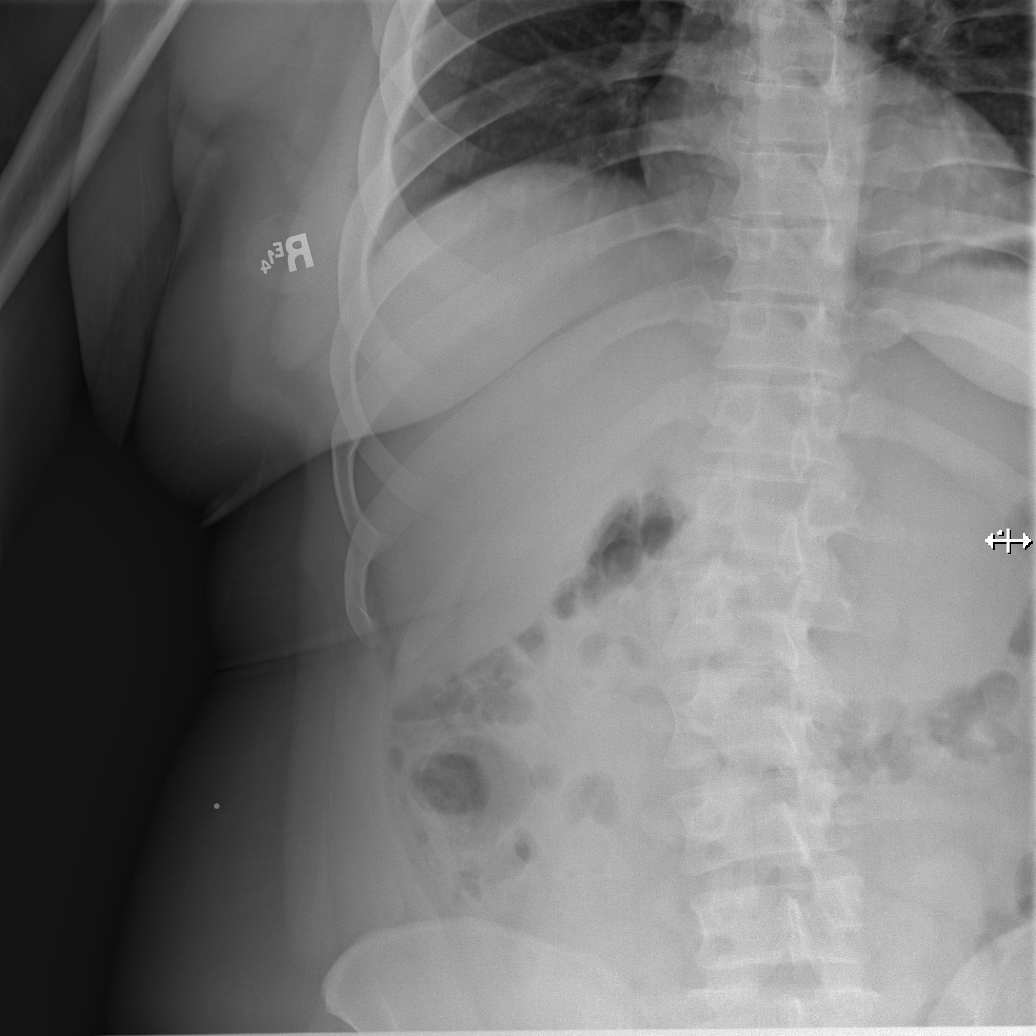

[w ribs obl right (1 of 2)]
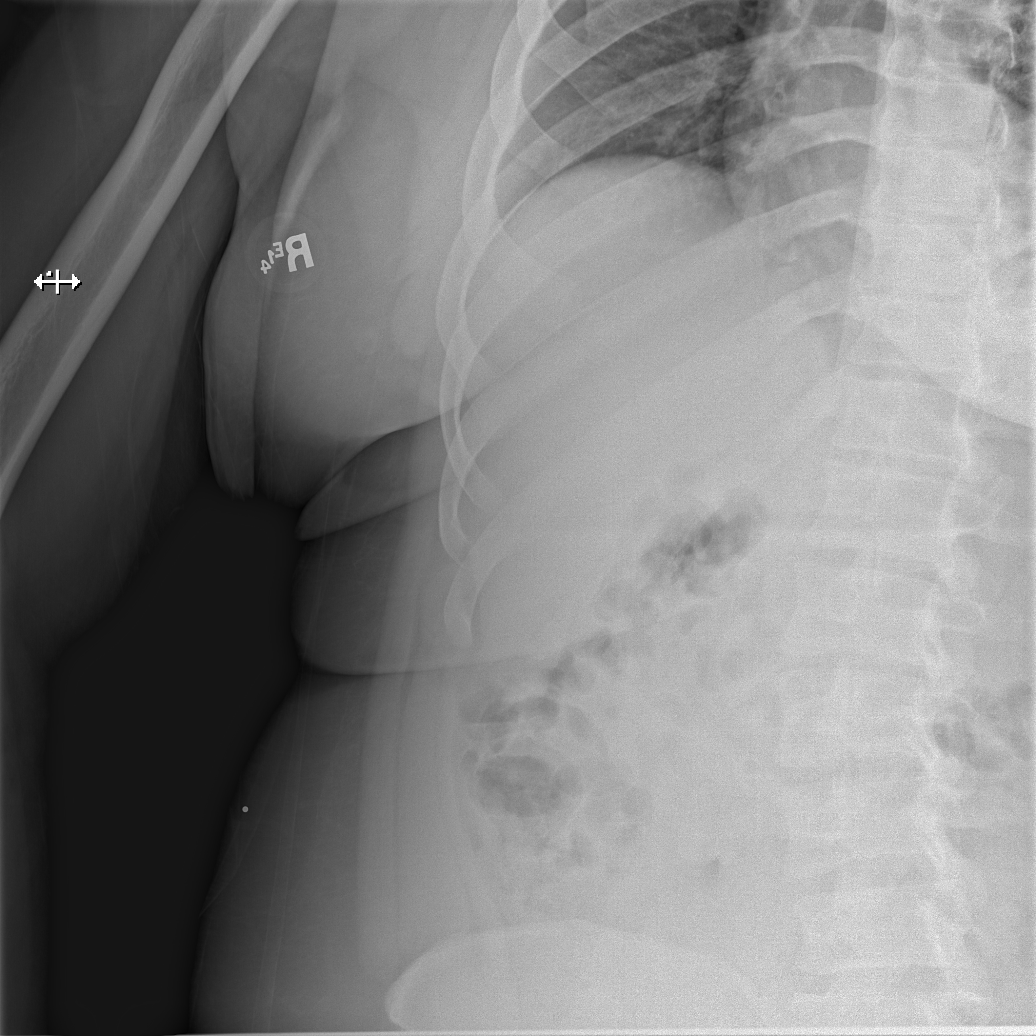

[w ribs obl right (2 of 2)]
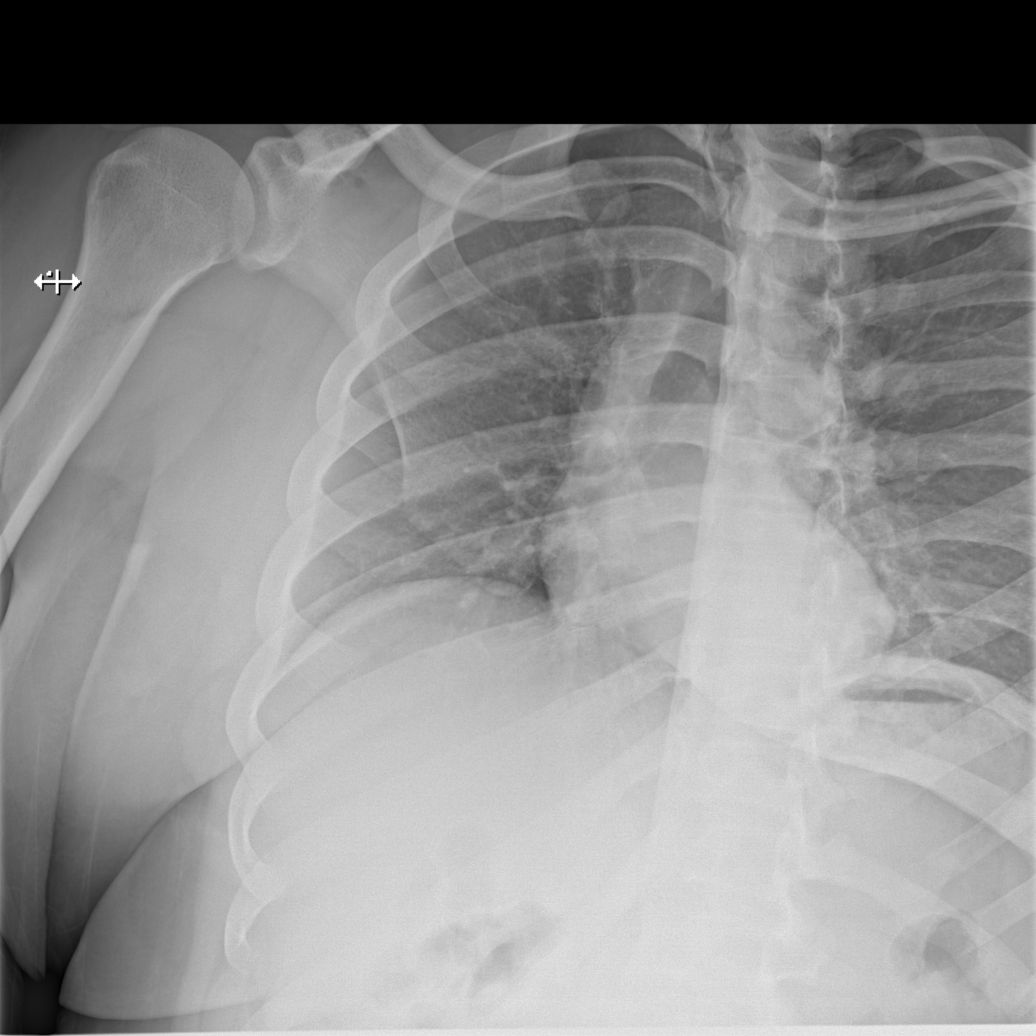

[5 of 5 positions shown; findings below may reference images not displayed]

FINDINGS: Cardiac shadow is within normal limits. The lungs are well aerated
without focal infiltrate, effusion or pneumothorax. No acute rib
fracture is noted.
IMPRESSION: No acute rib fracture seen.
